# Patient Record
Sex: Male | Born: 1960 | Hispanic: Yes | Marital: Single | Smoking: Never smoker
Health system: Southern US, Community
[De-identification: ages and names within clinical notes are randomized; demographics above are authoritative.]

## PROBLEM LIST (undated history)

## (undated) DIAGNOSIS — I10 Essential (primary) hypertension: Secondary | ICD-10-CM

## (undated) DIAGNOSIS — E119 Type 2 diabetes mellitus without complications: Secondary | ICD-10-CM

## (undated) DIAGNOSIS — E079 Disorder of thyroid, unspecified: Secondary | ICD-10-CM

---

## 2019-01-31 ENCOUNTER — Encounter (HOSPITAL_COMMUNITY): Payer: Self-pay | Admitting: *Deleted

## 2019-01-31 ENCOUNTER — Emergency Department (HOSPITAL_COMMUNITY)
Admission: EM | Admit: 2019-01-31 | Discharge: 2019-02-01 | Disposition: A | Discharge status: Home / Self Care | Payer: BLUE CROSS/BLUE SHIELD | Attending: Emergency Medicine | Admitting: Emergency Medicine

## 2019-01-31 ENCOUNTER — Other Ambulatory Visit: Payer: Self-pay

## 2019-01-31 DIAGNOSIS — R42 Dizziness and giddiness: Secondary | ICD-10-CM | POA: Diagnosis present

## 2019-01-31 DIAGNOSIS — E119 Type 2 diabetes mellitus without complications: Secondary | ICD-10-CM | POA: Insufficient documentation

## 2019-01-31 DIAGNOSIS — R197 Diarrhea, unspecified: Secondary | ICD-10-CM | POA: Insufficient documentation

## 2019-01-31 DIAGNOSIS — E079 Disorder of thyroid, unspecified: Secondary | ICD-10-CM | POA: Insufficient documentation

## 2019-01-31 DIAGNOSIS — I10 Essential (primary) hypertension: Secondary | ICD-10-CM | POA: Diagnosis not present

## 2019-01-31 HISTORY — DX: Essential (primary) hypertension: I10

## 2019-01-31 HISTORY — DX: Type 2 diabetes mellitus without complications: E11.9

## 2019-01-31 HISTORY — DX: Disorder of thyroid, unspecified: E07.9

## 2019-01-31 LAB — BASIC METABOLIC PANEL
Anion gap: 6 (ref 5–15)
BUN: 18 mg/dL (ref 6–20)
CO2: 26 mmol/L (ref 22–32)
Calcium: 9.4 mg/dL (ref 8.9–10.3)
Chloride: 109 mmol/L (ref 98–111)
Creatinine, Ser: 1.07 mg/dL (ref 0.61–1.24)
GFR calc Af Amer: >60 mL/min (ref >60)
GFR calc non Af Amer: >60 mL/min (ref >60)
Glucose, Bld: 100 mg/dL — ABNORMAL HIGH (ref 70–99)
Potassium: 4.6 mmol/L (ref 3.5–5.1)
Sodium: 141 mmol/L (ref 135–145)

## 2019-01-31 LAB — CBC
HCT: 43.6 % (ref 39.0–52.0)
Hemoglobin: 14.9 g/dL (ref 13.0–17.0)
MCH: 31.5 pg (ref 26.0–34.0)
MCHC: 34.2 g/dL (ref 30.0–36.0)
MCV: 92.2 fL (ref 80.0–100.0)
Platelets: 182 10*3/uL (ref 150–400)
RBC: 4.73 MIL/uL (ref 4.22–5.81)
RDW: 12.2 % (ref 11.5–15.5)
WBC: 4.9 10*3/uL (ref 4.0–10.5)
nRBC: 0 % (ref 0.0–0.2)

## 2019-01-31 NOTE — ED Triage Notes (Signed)
Pt c/o left sided headache, nausea, hypertension, and dizziness for the past 3 weeks. Pt takes losartan.

## 2019-02-01 ENCOUNTER — Emergency Department (HOSPITAL_COMMUNITY): Payer: BLUE CROSS/BLUE SHIELD

## 2019-02-01 ENCOUNTER — Other Ambulatory Visit: Payer: Self-pay

## 2019-02-01 ENCOUNTER — Emergency Department (HOSPITAL_COMMUNITY)
Admission: EM | Admit: 2019-02-01 | Discharge: 2019-02-02 | Disposition: A | Discharge status: Home / Self Care | Payer: BLUE CROSS/BLUE SHIELD | Source: Home / Self Care | Attending: Emergency Medicine | Admitting: Emergency Medicine

## 2019-02-01 ENCOUNTER — Encounter (HOSPITAL_COMMUNITY): Payer: Self-pay | Admitting: *Deleted

## 2019-02-01 DIAGNOSIS — I10 Essential (primary) hypertension: Secondary | ICD-10-CM | POA: Insufficient documentation

## 2019-02-01 DIAGNOSIS — R42 Dizziness and giddiness: Secondary | ICD-10-CM | POA: Insufficient documentation

## 2019-02-01 DIAGNOSIS — E119 Type 2 diabetes mellitus without complications: Secondary | ICD-10-CM | POA: Insufficient documentation

## 2019-02-01 LAB — BASIC METABOLIC PANEL
Anion gap: 10 (ref 5–15)
BUN: 18 mg/dL (ref 6–20)
CO2: 25 mmol/L (ref 22–32)
Calcium: 9.2 mg/dL (ref 8.9–10.3)
Chloride: 104 mmol/L (ref 98–111)
Creatinine, Ser: 0.68 mg/dL (ref 0.61–1.24)
GFR calc Af Amer: >60 mL/min (ref >60)
GFR calc non Af Amer: >60 mL/min (ref >60)
Glucose, Bld: 97 mg/dL (ref 70–99)
Potassium: 4 mmol/L (ref 3.5–5.1)
Sodium: 139 mmol/L (ref 135–145)

## 2019-02-01 LAB — TROPONIN I (HIGH SENSITIVITY)
Troponin I (High Sensitivity): 3 ng/L (ref <18)
Troponin I (High Sensitivity): 3 ng/L (ref <18)

## 2019-02-01 LAB — CBC
HCT: 44.7 % (ref 39.0–52.0)
Hemoglobin: 15.2 g/dL (ref 13.0–17.0)
MCH: 31.2 pg (ref 26.0–34.0)
MCHC: 34 g/dL (ref 30.0–36.0)
MCV: 91.8 fL (ref 80.0–100.0)
Platelets: 185 10*3/uL (ref 150–400)
RBC: 4.87 MIL/uL (ref 4.22–5.81)
RDW: 12.1 % (ref 11.5–15.5)
WBC: 4.9 10*3/uL (ref 4.0–10.5)
nRBC: 0 % (ref 0.0–0.2)

## 2019-02-01 MED ORDER — SODIUM CHLORIDE 0.9% FLUSH: 3.0000 mL | Freq: Once | INTRAVENOUS | Status: DC

## 2019-02-01 MED ORDER — DIPHENOXYLATE-ATROPINE 2.5-0.025 MG PO TABS: 1.0000 | ORAL_TABLET | Freq: Four times a day (QID) | ORAL | 0 refills | Status: AC | PRN

## 2019-02-01 NOTE — ED Triage Notes (Signed)
pts bo has been high with dizziness for 3 weeks   Pressure sensation in his lt chest

## 2019-02-01 NOTE — ED Provider Notes (Signed)
[facility] EMERGENCY DEPARTMENT Provider Note   CSN: [id number] Arrival date & time: 01/31/19  2004     History   Chief Complaint Chief Complaint  Patient presents with  . Hypertension    HPI Cole Higgins is a 58 y.o. male.     Patient presents to the emergency department for evaluation of elevated blood pressure with dizziness.  He reports that he is treated for blood pressure, has been taking his medications.  He works outside in [employer].  He has noticed that during the day he gets dizzy and lightheaded when he moves around.  He also reports that he has had diarrhea on and off for the last 2 weeks.  No recent antibiotics.  No recent travel.  He is not experiencing any abdominal pain.     Past Medical History:  Diagnosis Date  . Diabetes mellitus without complication ([place])   . Hypertension   . Thyroid disease     There are no active problems to display for this patient.   History reviewed. No pertinent surgical history.      Home Medications    Prior to Admission medications   Medication Sig Start Date End Date Taking? Authorizing Provider  diphenoxylate-atropine (LOMOTIL) 2.5-0.025 MG tablet Take 1-2 tablets by mouth 4 (four) times daily as needed for diarrhea or loose stools. 02/01/19   Orpah Greek, MD    Family History No family history on file.  Social History Social History   Tobacco Use  . Smoking status: Never Smoker  Substance Use Topics  . Alcohol use: Never    Frequency: Never  . Drug use: Never     Allergies   Patient has no known allergies.   Review of Systems Review of Systems  Gastrointestinal: Positive for diarrhea.  Neurological: Positive for dizziness.  All other systems reviewed and are negative.    Physical Exam Updated Vital Signs BP 113/69   Pulse 64   Temp 97.7 F (36.5 C) (Oral)   Resp 18   SpO2 100%   Physical Exam Vitals signs and nursing note reviewed.  Constitutional:     General: He is not in acute distress.    Appearance: Normal appearance. He is well-developed.  HENT:     Head: Normocephalic and atraumatic.     Right Ear: Hearing normal.     Left Ear: Hearing normal.     Nose: Nose normal.  Eyes:     Conjunctiva/sclera: Conjunctivae normal.     Pupils: Pupils are equal, round, and reactive to light.  Neck:     Musculoskeletal: Normal range of motion and neck supple.  Cardiovascular:     Rate and Rhythm: Regular rhythm.     Heart sounds: S1 normal and S2 normal. No murmur. No friction rub. No gallop.   Pulmonary:     Effort: Pulmonary effort is normal. No respiratory distress.     Breath sounds: Normal breath sounds.  Chest:     Chest wall: No tenderness.  Abdominal:     General: Bowel sounds are normal.     Palpations: Abdomen is soft.     Tenderness: There is no abdominal tenderness. There is no guarding or rebound. Negative signs include Murphy's sign and McBurney's sign.     Hernia: No hernia is present.  Musculoskeletal: Normal range of motion.  Skin:    General: Skin is warm and dry.     Findings: No rash.  Neurological:     Mental Status: He  is alert and oriented to person, place, and time.     GCS: GCS eye subscore is 4. GCS verbal subscore is 5. GCS motor subscore is 6.     Cranial Nerves: No cranial nerve deficit.     Sensory: No sensory deficit.     Coordination: Coordination normal.  Psychiatric:        Speech: Speech normal.        Behavior: Behavior normal.        Thought Content: Thought content normal.      ED Treatments / Results  Labs (all labs ordered are listed, but only abnormal results are displayed) Labs Reviewed  BASIC METABOLIC PANEL - Abnormal; Notable for the following components:      Result Value   Glucose, Bld 100 (*)    All other components within normal limits  CBC    EKG None  Radiology No results found.  Procedures Procedures (including critical care time)  Medications Ordered in ED  Medications - No data to display   Initial Impression / Assessment and Plan / ED Course  I have reviewed the triage vital signs and the nursing notes.  Pertinent labs & imaging results that were available during my care of the patient were reviewed by me and considered in my medical decision making (see chart for details).        Patient appears well, no abdominal pain.  Abdominal exam is benign.  He reports dizziness but he has a normal neurologic exam here in the ER.  No headache.  Patient works outside in [employer] and likely has some mild dehydration.  Renal function is normal, however.  He also reports that he has had some chronic and intermittent diarrhea.  No rectal bleeding.  Encourage oral hydration, treat diarrhea.  Follow-up with GI if not improving.  Final Clinical Impressions(s) / ED Diagnoses   Final diagnoses:  Diarrhea, unspecified type    ED Discharge Orders         Ordered    diphenoxylate-atropine (LOMOTIL) 2.5-0.025 MG tablet  4 times daily PRN     02/01/19 0434           Gilda CreasePollina, Trystan Akhtar J, MD 02/01/19 [phone]

## 2019-02-02 ENCOUNTER — Emergency Department (HOSPITAL_COMMUNITY): Payer: BLUE CROSS/BLUE SHIELD

## 2019-02-02 MED ORDER — MECLIZINE HCL 25 MG PO TABS: 25.0000 mg | ORAL_TABLET | Freq: Three times a day (TID) | ORAL | 0 refills | Status: AC | PRN

## 2019-02-02 NOTE — ED Notes (Signed)
To mri 

## 2019-02-02 NOTE — ED Notes (Signed)
Pt returned from [place] oriented he feels better

## 2019-02-02 NOTE — ED Provider Notes (Signed)
[facility] Provider Note   CSN: [id number] Arrival date & time: 02/01/19  1942     History   Chief Complaint Chief Complaint  Patient presents with   Hypertension    HPI Cole Higgins is a 58 y.o. male.     The history is provided by the patient. A language interpreter was used ([id number]).  Dizziness Quality:  Room spinning Severity:  Moderate Duration:  1 day Timing:  Intermittent Progression:  Worsening Chronicity:  New Relieved by:  Nothing Worsened by:  Movement Associated symptoms: headaches, nausea, shortness of breath and weakness   Associated symptoms: no vision changes and no vomiting   pt with h/o DM/HTN Pt denies h/o CAD/CVA He reports intermittent dizziness No focal weakness but reports generalized fatigue Reports checked his BP twice today and was elevated  Past Medical History:  Diagnosis Date   Diabetes mellitus without complication (H[facility])    Hypertension    Thyroid disease           Home Medications    Prior to Admission medications   Medication Sig Start Date End Date Taking? Authorizing Provider  diphenoxylate-atropine (LOMOTIL) 2.5-0.025 MG tablet Take 1-2 tablets by mouth 4 (four) times daily as needed for diarrhea or loose stools. 02/01/19   Gilda CreasePollina, Christopher J, MD    Family History No family history on file.  Social History Social History   Tobacco Use   Smoking status: Never Smoker   Smokeless tobacco: Never Used  Substance Use Topics   Alcohol use: Never    Frequency: Never   Drug use: Never     Allergies   Patient has no known allergies.   Review of Systems Review of Systems  Constitutional: Negative for fever.  Eyes: Negative for visual disturbance.  Respiratory: Positive for shortness of breath.   Cardiovascular:       Left chest burning for several months   Gastrointestinal: Positive for nausea. Negative for vomiting.  Neurological: Positive for dizziness,  weakness and headaches. Negative for syncope and speech difficulty.       Left head pressure   All other systems reviewed and are negative.    Physical Exam Updated Vital Signs BP 131/87 (BP Location: Right Arm)    Pulse 79    Temp 97.8 F (36.6 C)    Resp 16    Ht 1.753 m (5\' 9" )    Wt 95.3 kg    SpO2 100%    BMI 31.01 kg/m   Physical Exam CONSTITUTIONAL: Well developed/well nourished HEAD: Normocephalic/atraumatic no rash, no swelling to scalp EYES: EOMI/PERRL, no nystagmus,no ptosis ENMT: Mucous membranes moist, left TM clear and intact NECK: supple no meningeal signs CV: S1/S2 noted, no murmurs/rubs/gallops noted LUNGS: Lungs are clear to auscultation bilaterally, no apparent distress ABDOMEN: soft, nontender, no rebound or guarding GU:no cva tenderness NEURO:Awake/alert, face symmetric, no arm or leg drift is noted Equal 5/5 strength with shoulder abduction, elbow flex/extension, wrist flex/extension in upper extremities and equal hand grips bilaterally Equal 5/5 strength with hip flexion,knee flex/extension, foot dorsi/plantar flexion Cranial nerves 3/4/5/6/12/25/08/11/12 tested and intact Gait normal without ataxia No past pointing Sensation to light touch intact in all extremities EXTREMITIES: pulses normal, full ROM SKIN: warm, color normal PSYCH: no abnormalities of mood noted   ED Treatments / Results  Labs (all labs ordered are listed, but only abnormal results are displayed) Labs Reviewed  BASIC METABOLIC PANEL  CBC  TROPONIN I (HIGH SENSITIVITY)  TROPONIN I (HIGH SENSITIVITY)  EKG EKG Interpretation  Date/Time:  Saturday February 01 2019 20:14:32 EDT Ventricular Rate:  81 PR Interval:  146 QRS Duration: 96 QT Interval:  356 QTC Calculation: 413 R Axis:   53 Text Interpretation:  Normal sinus rhythm Normal ECG No previous ECGs available Confirmed by Zadie RhineWickline, Maritza Hosterman ([id number]) on 02/01/2019 11:58:22 PM   Radiology Dg Chest 2 View  Result Date:  02/01/2019 CLINICAL DATA:  Hypertension. Chest pain. Dizziness. EXAM: CHEST - 2 VIEW COMPARISON:  None. FINDINGS: The cardiomediastinal contours are normal. The lungs are clear. Pulmonary vasculature is normal. No consolidation, pleural effusion, or pneumothorax. No acute osseous abnormalities are seen. IMPRESSION: No acute chest findings. Electronically Signed   By: Narda RutherfordMelanie  Sanford M.D.   On: 02/01/2019 21:43   Mr Brain Wo Contrast  Result Date: 02/02/2019 CLINICAL DATA:  Vertigo and left-sided headache EXAM: MRI HEAD WITHOUT CONTRAST TECHNIQUE: Multiplanar, multiecho pulse sequences of the brain and surrounding structures were obtained without intravenous contrast. COMPARISON:  None. FINDINGS: BRAIN: There is no acute infarct, acute hemorrhage or extra-axial collection. The white matter signal is normal for the patient's age. The cerebral and cerebellar volume are age-appropriate. There is no hydrocephalus. The midline structures are normal. There is an extra-axial focus of diffusion restriction at the anterior right ambient cistern (series 5, image 61). This is in the region of the Dorello canal, where the abducent is nerve enters the skull base. VASCULAR: Susceptibility-sensitive sequences show no chronic microhemorrhage or superficial siderosis. The major intracranial arterial and venous sinus flow voids are normal. SKULL AND UPPER CERVICAL SPINE: Calvarial bone marrow signal is normal. There is no skull base mass. The visualized upper cervical spine and soft tissues are normal. SINUSES/ORBITS: There are no fluid levels or advanced mucosal thickening. The mastoid air cells and middle ear cavities are free of fluid. The orbits are normal. IMPRESSION: 1. No acute intracranial abnormality. 2. Normal appearance of the brain. 3. Focus of diffusion restriction within the right ambient cistern, of uncertain significance. This may be artifactual, but it could represent a small epidermoid. This location could  affect the 6 cranial nerve as it enters the skull base and could cause an abducens palsy. Electronically Signed   By: Deatra RobinsonKevin  Herman M.D.   On: 02/02/2019 02:46    Procedures Procedures   Medications Ordered in ED Medications  sodium chloride flush (NS) 0.9 % injection 3 mL (has no administration in time range)     Initial Impression / Assessment and Plan / ED Course  I have reviewed the triage vital signs and the nursing notes.  Pertinent labs  results that were available during my care of the patient were reviewed by me and considered in my medical decision making (see chart for details).        12:14 AM Pt with repeat visit for dizziness/vertigo He has h/o HTN/DM Will perform MRI to r/o stroke Pt otherwise well appearing 3:34 AM Interpreter [phone] utilized Patient appears improved.  No acute distress.  MRI is negative for any acute process.  Suspect peripheral vertigo.  No cranial nerve deficits Will treat for peripheral vertigo, given meclizine.  He will follow[place]up with [place] Final Clinical Impressions(s) / ED Diagnoses   Final diagnoses:  Vertigo    ED Discharge Orders         Ordered    meclizine (ANTIVERT) 25 MG tablet  3 times daily PRN     02/02/19 0339  Ripley Fraise, MD 02/02/19 [phone]

## 2021-05-10 IMAGING — MR MRI HEAD WITHOUT CONTRAST
10 of 11 series · 43 of 48 positions shown · non-contrast
Comparison: None.

CLINICAL DATA: Vertigo and left-sided headache

EXAM:
MRI HEAD WITHOUT CONTRAST
TECHNIQUE: Multiplanar, multiecho pulse sequences of the brain and surrounding
structures were obtained without intravenous contrast.

[Series 5: DWI · axial · 3.0mm · 0.88mm/px · z∈[-38,+103]mm · 10 of 96 slices shown (1 of 4)]
[im 1/96]
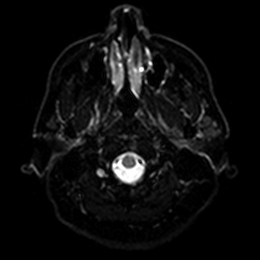
[im 11/96]
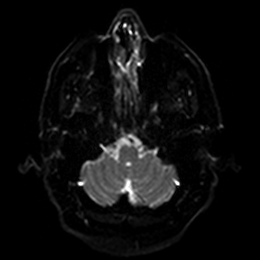
[im 22/96]
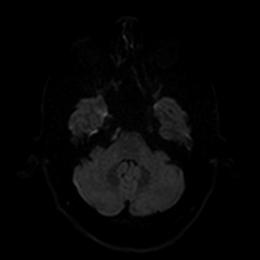
[im 32/96]
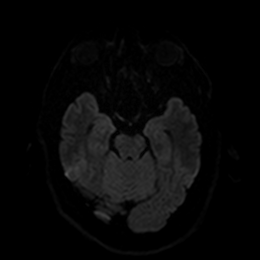
[im 43/96]
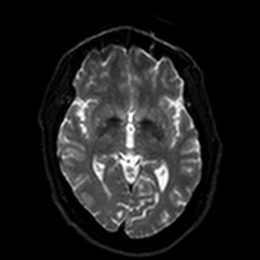
[im 53/96]
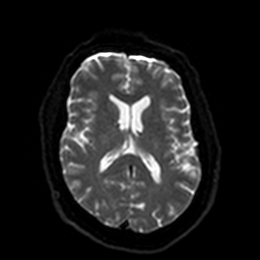
[im 64/96]
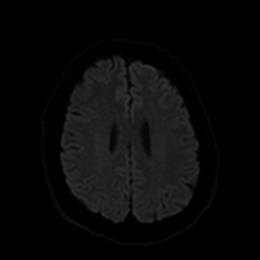
[im 74/96]
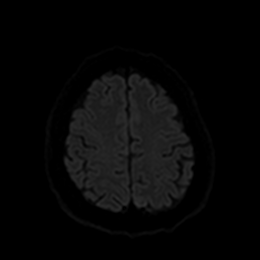
[im 85/96]
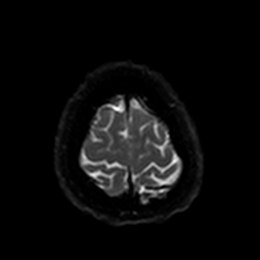
[im 96/96]
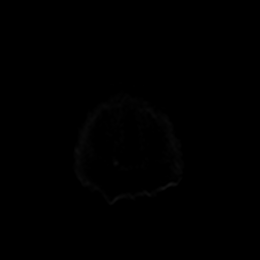

[Series 6: DWI · axial · 3.0mm · 0.88mm/px · z∈[-38,+103]mm · 5 of 48 slices shown (2 of 4)]
[im 1/48]
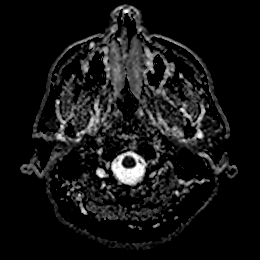
[im 12/48]
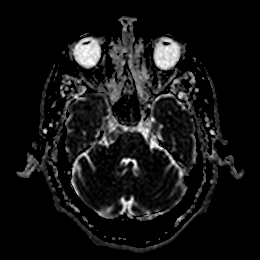
[im 24/48]
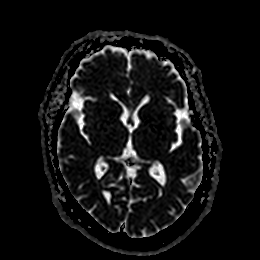
[im 36/48]
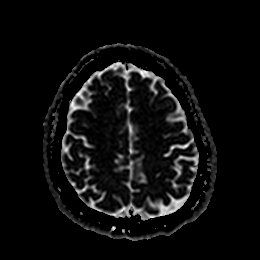
[im 48/48]
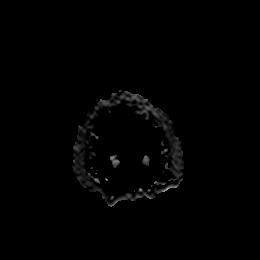

[Series 7: DWI · coronal · 4.0mm · 0.88mm/px · 6 of 70 slices shown (3 of 4)]
[im 1/70]
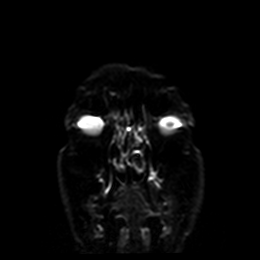
[im 14/70]
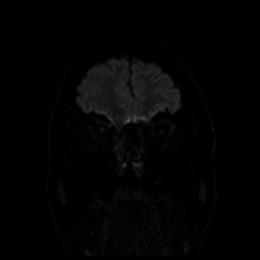
[im 28/70]
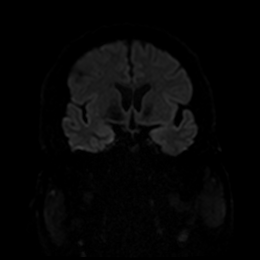
[im 42/70]
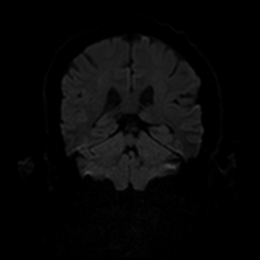
[im 56/70]
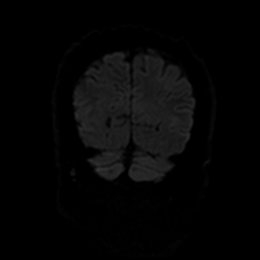
[im 70/70]
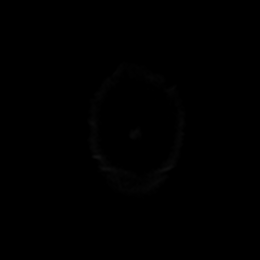

[Series 8: DWI · coronal · 4.0mm · 0.88mm/px · 3 of 35 slices shown (4 of 4)]
[im 1/35]
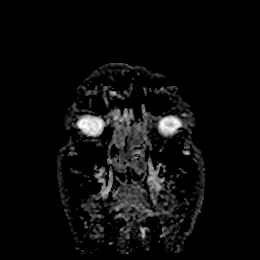
[im 18/35]
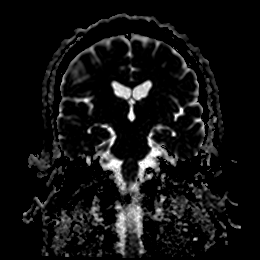
[im 35/35]
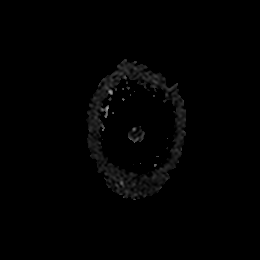

[Series 9: T1 · sagittal · 5.0mm · 0.75mm/px · 2 of 25 slices shown]
[im 1/25]
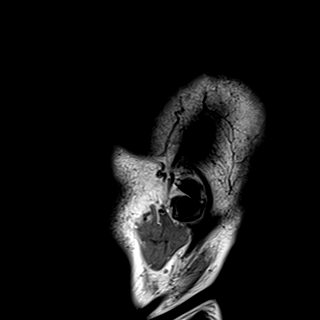
[im 25/25]
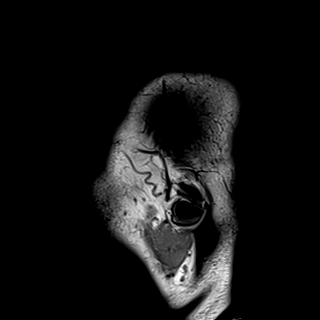

[Series 10: T2 · axial · 5.0mm · 0.72mm/px · z∈[-47,+97]mm · 2 of 25 slices shown (1 of 2)]
[im 1/25]
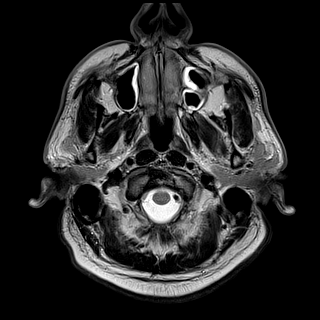
[im 25/25]
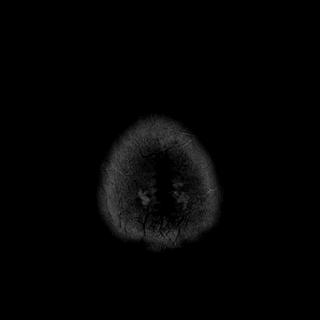

[Series 11: FLAIR · axial · 5.0mm · 0.45mm/px · z∈[-48,+96]mm · 2 of 25 slices shown]
[im 1/25]
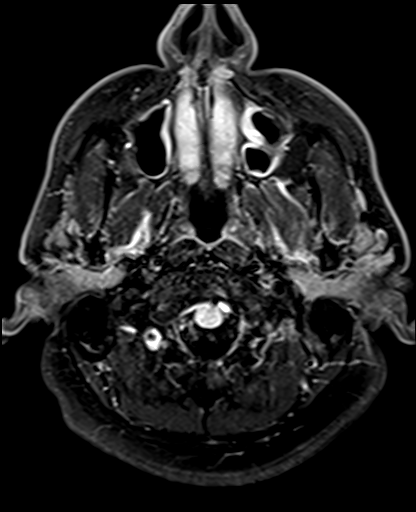
[im 25/25]
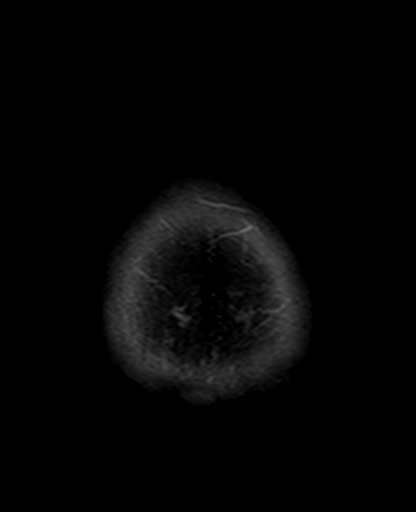

[Series 13: pha_images · axial · 3.0mm · 0.90mm/px · z∈[-55,+118]mm · 5 of 57 slices shown]
[im 1/57]
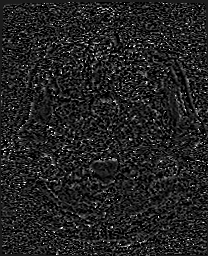
[im 15/57]
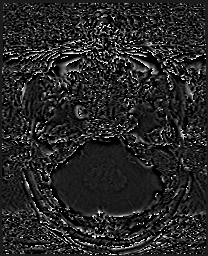
[im 29/57]
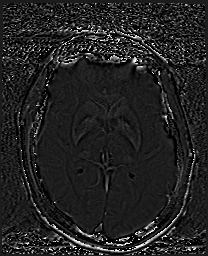
[im 43/57]
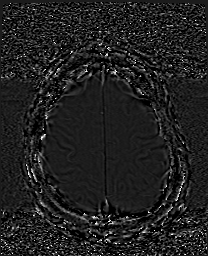
[im 57/57]
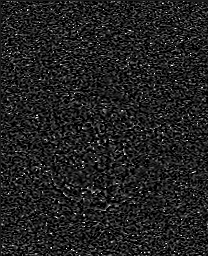

[Series 14: swi_images · axial · 3.0mm · 0.90mm/px · z∈[-55,+121]mm · 5 of 60 slices shown]
[im 1/60]
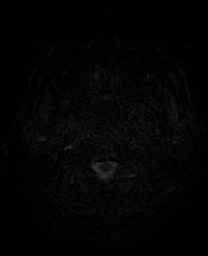
[im 15/60]
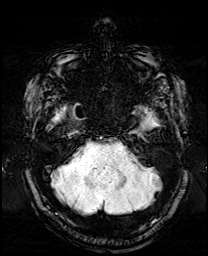
[im 30/60]
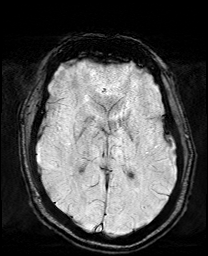
[im 45/60]
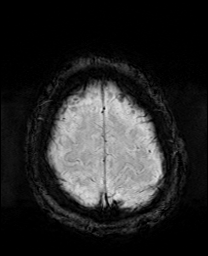
[im 60/60]
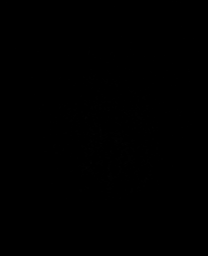

[Series 17: T2 · coronal · 5.0mm · 0.34mm/px · 3 of 30 slices shown (2 of 2)]
[im 1/30]
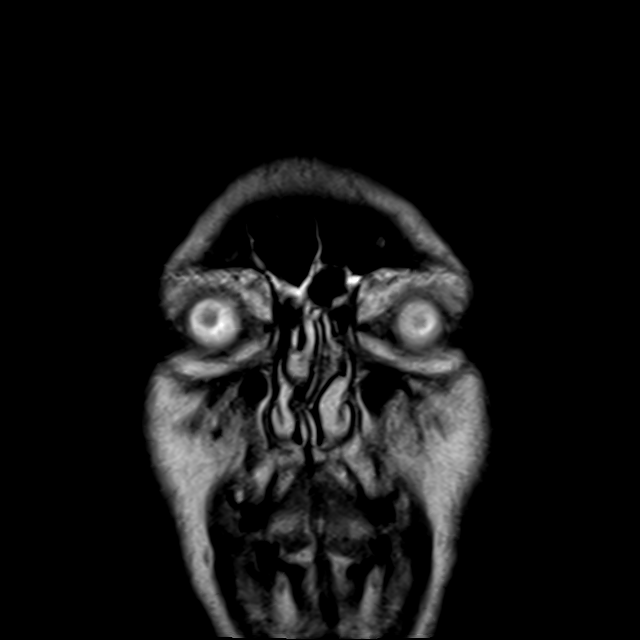
[im 15/30]
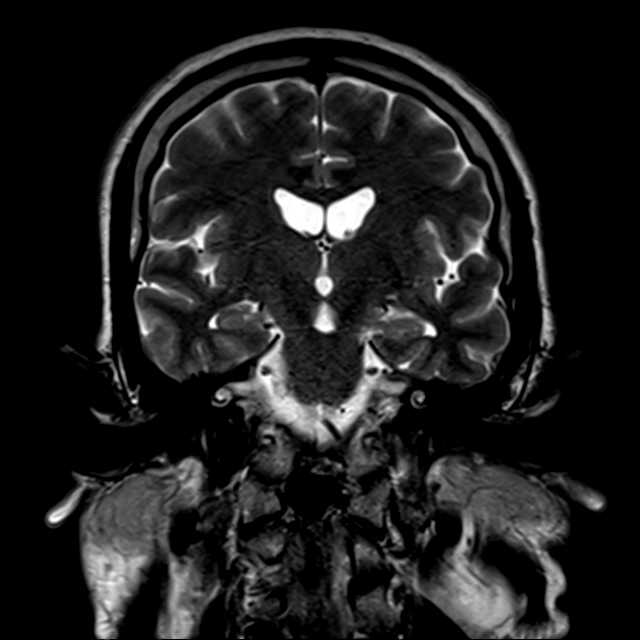
[im 30/30]
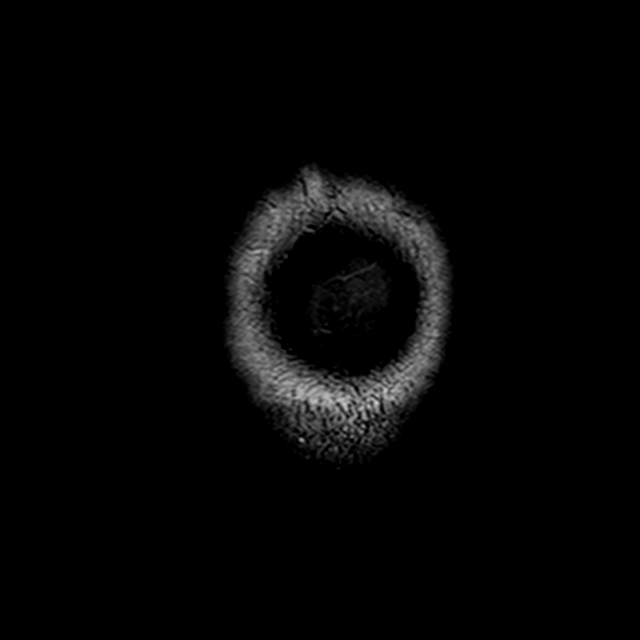

[43 of 48 positions shown; findings below may reference images not displayed]

FINDINGS: BRAIN: There is no acute infarct, acute hemorrhage or extra-axial
collection. The white matter signal is normal for the patient's age.
The cerebral and cerebellar volume are age-appropriate. There is no
hydrocephalus. The midline structures are normal. There is an
extra-axial focus of diffusion restriction at the anterior right
ambient cistern (series 5, image 61). This is in the region of the
Dorello canal, where the abducent is nerve enters the skull base.

VASCULAR: Susceptibility-sensitive sequences show no chronic
microhemorrhage or superficial siderosis. The major intracranial
arterial and venous sinus flow voids are normal.

SKULL AND UPPER CERVICAL SPINE: Calvarial bone marrow signal is
normal. There is no skull base mass. The visualized upper cervical
spine and soft tissues are normal.

SINUSES/ORBITS: There are no fluid levels or advanced mucosal
thickening. The mastoid air cells and middle ear cavities are free
of fluid. The orbits are normal.
IMPRESSION: 1. No acute intracranial abnormality.
2. Normal appearance of the brain.
3. Focus of diffusion restriction within the right ambient cistern,
of uncertain significance. This may be artifactual, but it could
represent a small epidermoid. This location could affect the 6
cranial nerve as it enters the skull base and could cause an
abducens palsy.
